# Patient Record
Sex: Female | Born: 1955 | Race: Black or African American | Hispanic: No | Marital: Single | Smoking: Current every day smoker
Health system: Southern US, Community
[De-identification: ages and names within clinical notes are randomized; demographics above are authoritative.]

## PROBLEM LIST (undated history)

## (undated) DIAGNOSIS — E119 Type 2 diabetes mellitus without complications: Secondary | ICD-10-CM

## (undated) DIAGNOSIS — I10 Essential (primary) hypertension: Secondary | ICD-10-CM

## (undated) DIAGNOSIS — N289 Disorder of kidney and ureter, unspecified: Secondary | ICD-10-CM

## (undated) HISTORY — PX: TONSILLECTOMY: SUR1361

## (undated) HISTORY — PX: ABDOMINAL HYSTERECTOMY: SHX81

---

## 2019-07-26 ENCOUNTER — Other Ambulatory Visit: Payer: Self-pay

## 2019-07-26 ENCOUNTER — Encounter (HOSPITAL_BASED_OUTPATIENT_CLINIC_OR_DEPARTMENT_OTHER): Payer: Self-pay | Admitting: Emergency Medicine

## 2019-07-26 ENCOUNTER — Emergency Department (HOSPITAL_BASED_OUTPATIENT_CLINIC_OR_DEPARTMENT_OTHER): Payer: Medicare Other

## 2019-07-26 ENCOUNTER — Emergency Department (HOSPITAL_BASED_OUTPATIENT_CLINIC_OR_DEPARTMENT_OTHER)
Admission: EM | Admit: 2019-07-26 | Discharge: 2019-07-26 | Disposition: A | Discharge status: Home / Self Care | Payer: Medicare Other | Attending: Emergency Medicine | Admitting: Emergency Medicine

## 2019-07-26 DIAGNOSIS — Z79899 Other long term (current) drug therapy: Secondary | ICD-10-CM | POA: Insufficient documentation

## 2019-07-26 DIAGNOSIS — E119 Type 2 diabetes mellitus without complications: Secondary | ICD-10-CM | POA: Insufficient documentation

## 2019-07-26 DIAGNOSIS — X58XXXA Exposure to other specified factors, initial encounter: Secondary | ICD-10-CM | POA: Diagnosis not present

## 2019-07-26 DIAGNOSIS — S161XXA Strain of muscle, fascia and tendon at neck level, initial encounter: Secondary | ICD-10-CM | POA: Insufficient documentation

## 2019-07-26 DIAGNOSIS — Y998 Other external cause status: Secondary | ICD-10-CM | POA: Diagnosis not present

## 2019-07-26 DIAGNOSIS — S199XXA Unspecified injury of neck, initial encounter: Secondary | ICD-10-CM | POA: Diagnosis present

## 2019-07-26 DIAGNOSIS — Y9389 Activity, other specified: Secondary | ICD-10-CM | POA: Diagnosis not present

## 2019-07-26 DIAGNOSIS — Y9289 Other specified places as the place of occurrence of the external cause: Secondary | ICD-10-CM | POA: Diagnosis not present

## 2019-07-26 DIAGNOSIS — F17210 Nicotine dependence, cigarettes, uncomplicated: Secondary | ICD-10-CM | POA: Insufficient documentation

## 2019-07-26 DIAGNOSIS — I10 Essential (primary) hypertension: Secondary | ICD-10-CM | POA: Diagnosis not present

## 2019-07-26 HISTORY — DX: Essential (primary) hypertension: I10

## 2019-07-26 HISTORY — DX: Type 2 diabetes mellitus without complications: E11.9

## 2019-07-26 HISTORY — DX: Disorder of kidney and ureter, unspecified: N28.9

## 2019-07-26 MED ORDER — TRAMADOL HCL 50 MG PO TABS: 50.0000 mg | ORAL_TABLET | Freq: Four times a day (QID) | ORAL | 0 refills | Status: AC | PRN

## 2019-07-26 MED ORDER — CYCLOBENZAPRINE HCL 5 MG PO TABS: 5.0000 mg | ORAL_TABLET | Freq: Three times a day (TID) | ORAL | 0 refills | Status: AC | PRN

## 2019-07-26 MED ORDER — DIAZEPAM 5 MG/ML IJ SOLN
5.0000 mg | Freq: Once | INTRAMUSCULAR | Status: AC
  Administered 2019-07-26: 5 mg via INTRAMUSCULAR
  Filled 2019-07-26: qty 2

## 2019-07-26 MED ORDER — IBUPROFEN 800 MG PO TABS: 800.0000 mg | ORAL_TABLET | Freq: Once | ORAL | Status: DC

## 2019-07-26 MED ORDER — KETOROLAC TROMETHAMINE 60 MG/2ML IM SOLN
30.0000 mg | Freq: Once | INTRAMUSCULAR | Status: AC
  Administered 2019-07-26: 30 mg via INTRAMUSCULAR
  Filled 2019-07-26: qty 2

## 2019-07-26 NOTE — ED Triage Notes (Signed)
Patient states that she has had pain to her left neck and shoulder x 1 week at least. She reports that it hurts more with movement. Patient denies any chest pain or SOB
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
## 2019-07-26 NOTE — Discharge Instructions (Addendum)
Take motrin for pain   Take tramadol for severe pain   Take flexeril for muscle spasms   Avoid heavy lifting   See ortho for follow up   Return to ER if you have worse neck pain, shoulder pain, arm numbness or weakness
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
